# Patient Record
Sex: Male | Born: 1960 | Race: Black or African American | Hispanic: No | Smoking: Former smoker
Health system: Southern US, Community
[De-identification: ages and names within clinical notes are randomized; demographics above are authoritative.]

## PROBLEM LIST (undated history)

## (undated) HISTORY — PX: HERNIA REPAIR: SHX51

---

## 1998-01-17 ENCOUNTER — Encounter: Payer: Self-pay | Admitting: Emergency Medicine

## 1998-01-17 ENCOUNTER — Emergency Department (HOSPITAL_COMMUNITY): Admission: EM | Admit: 1998-01-17 | Discharge: 1998-01-17 | Payer: Self-pay | Admitting: Emergency Medicine

## 1998-10-29 ENCOUNTER — Emergency Department (HOSPITAL_COMMUNITY): Admission: EM | Admit: 1998-10-29 | Discharge: 1998-10-29 | Payer: Self-pay | Admitting: Emergency Medicine

## 2001-11-10 ENCOUNTER — Emergency Department (HOSPITAL_COMMUNITY): Admission: EM | Admit: 2001-11-10 | Discharge: 2001-11-11 | Payer: Self-pay | Admitting: Emergency Medicine

## 2001-11-11 ENCOUNTER — Encounter: Payer: Self-pay | Admitting: Emergency Medicine

## 2001-11-14 ENCOUNTER — Emergency Department (HOSPITAL_COMMUNITY): Admission: EM | Admit: 2001-11-14 | Discharge: 2001-11-15 | Payer: Self-pay | Admitting: Emergency Medicine

## 2001-11-14 ENCOUNTER — Encounter: Payer: Self-pay | Admitting: Emergency Medicine

## 2004-01-04 ENCOUNTER — Emergency Department (HOSPITAL_COMMUNITY): Admission: EM | Admit: 2004-01-04 | Discharge: 2004-01-04 | Payer: Self-pay

## 2005-02-07 ENCOUNTER — Emergency Department (HOSPITAL_COMMUNITY): Admission: EM | Admit: 2005-02-07 | Discharge: 2005-02-07 | Payer: Self-pay | Admitting: Emergency Medicine

## 2006-08-12 ENCOUNTER — Emergency Department (HOSPITAL_COMMUNITY): Admission: EM | Admit: 2006-08-12 | Discharge: 2006-08-12 | Payer: Self-pay | Admitting: Emergency Medicine

## 2007-12-14 ENCOUNTER — Emergency Department (HOSPITAL_COMMUNITY): Admission: EM | Admit: 2007-12-14 | Discharge: 2007-12-14 | Payer: Self-pay | Admitting: Emergency Medicine

## 2008-08-29 ENCOUNTER — Emergency Department (HOSPITAL_COMMUNITY): Admission: EM | Admit: 2008-08-29 | Discharge: 2008-08-29 | Payer: Self-pay | Admitting: Emergency Medicine

## 2009-08-29 ENCOUNTER — Emergency Department (HOSPITAL_COMMUNITY): Admission: EM | Admit: 2009-08-29 | Discharge: 2009-08-29 | Payer: Self-pay | Admitting: Emergency Medicine

## 2010-08-26 ENCOUNTER — Emergency Department (HOSPITAL_COMMUNITY): Payer: Self-pay

## 2010-08-26 ENCOUNTER — Emergency Department (HOSPITAL_COMMUNITY)
Admission: EM | Admit: 2010-08-26 | Discharge: 2010-08-26 | Disposition: A | Discharge status: Home / Self Care | Payer: Self-pay | Attending: Emergency Medicine | Admitting: Emergency Medicine

## 2010-08-26 DIAGNOSIS — M7989 Other specified soft tissue disorders: Secondary | ICD-10-CM | POA: Insufficient documentation

## 2010-08-26 DIAGNOSIS — M129 Arthropathy, unspecified: Secondary | ICD-10-CM | POA: Insufficient documentation

## 2010-08-26 DIAGNOSIS — M79609 Pain in unspecified limb: Secondary | ICD-10-CM | POA: Insufficient documentation

## 2010-08-26 DIAGNOSIS — M25469 Effusion, unspecified knee: Secondary | ICD-10-CM | POA: Insufficient documentation

## 2010-08-26 LAB — POCT I-STAT, CHEM 8
Chloride: 105 mEq/L (ref 96–112)
HCT: 43 % (ref 39.0–52.0)
Hemoglobin: 14.6 g/dL (ref 13.0–17.0)
Potassium: 3.9 mEq/L (ref 3.5–5.1)
Sodium: 142 mEq/L (ref 135–145)

## 2010-08-26 LAB — CBC
HCT: 41.3 % (ref 39.0–52.0)
Hemoglobin: 13.3 g/dL (ref 13.0–17.0)
RBC: 4.6 MIL/uL (ref 4.22–5.81)
RDW: 13.9 % (ref 11.5–15.5)
WBC: 7 10*3/uL (ref 4.0–10.5)

## 2010-08-26 LAB — DIFFERENTIAL
Basophils Absolute: 0 10*3/uL (ref 0.0–0.1)
Lymphocytes Relative: 28 % (ref 12–46)
Neutro Abs: 4.2 10*3/uL (ref 1.7–7.7)
Neutrophils Relative %: 59 % (ref 43–77)

## 2010-08-26 LAB — URIC ACID: Uric Acid, Serum: 4.7 mg/dL (ref 4.0–7.8)

## 2010-11-09 ENCOUNTER — Emergency Department (HOSPITAL_COMMUNITY): Payer: No Typology Code available for payment source

## 2010-11-09 ENCOUNTER — Emergency Department (HOSPITAL_COMMUNITY)
Admission: EM | Admit: 2010-11-09 | Discharge: 2010-11-09 | Disposition: A | Discharge status: Home / Self Care | Payer: No Typology Code available for payment source | Attending: Emergency Medicine | Admitting: Emergency Medicine

## 2010-11-09 ENCOUNTER — Encounter (HOSPITAL_COMMUNITY): Payer: Self-pay

## 2010-11-09 DIAGNOSIS — M545 Low back pain, unspecified: Secondary | ICD-10-CM | POA: Insufficient documentation

## 2010-11-09 DIAGNOSIS — M25439 Effusion, unspecified wrist: Secondary | ICD-10-CM | POA: Insufficient documentation

## 2010-11-09 DIAGNOSIS — M542 Cervicalgia: Secondary | ICD-10-CM | POA: Insufficient documentation

## 2010-11-09 DIAGNOSIS — S335XXA Sprain of ligaments of lumbar spine, initial encounter: Secondary | ICD-10-CM | POA: Insufficient documentation

## 2010-11-09 DIAGNOSIS — S63509A Unspecified sprain of unspecified wrist, initial encounter: Secondary | ICD-10-CM | POA: Insufficient documentation

## 2010-11-09 DIAGNOSIS — M25539 Pain in unspecified wrist: Secondary | ICD-10-CM | POA: Insufficient documentation

## 2010-11-12 ENCOUNTER — Inpatient Hospital Stay (INDEPENDENT_AMBULATORY_CARE_PROVIDER_SITE_OTHER)
Admission: RE | Admit: 2010-11-12 | Discharge: 2010-11-12 | Disposition: A | Discharge status: Home / Self Care | Payer: Self-pay | Source: Ambulatory Visit | Attending: Family Medicine | Admitting: Family Medicine

## 2010-11-12 DIAGNOSIS — S335XXA Sprain of ligaments of lumbar spine, initial encounter: Secondary | ICD-10-CM

## 2010-12-12 ENCOUNTER — Other Ambulatory Visit (HOSPITAL_COMMUNITY): Payer: Self-pay | Admitting: Chiropractic Medicine

## 2010-12-12 DIAGNOSIS — R52 Pain, unspecified: Secondary | ICD-10-CM

## 2010-12-17 ENCOUNTER — Ambulatory Visit (HOSPITAL_COMMUNITY)
Admission: RE | Admit: 2010-12-17 | Discharge: 2010-12-17 | Disposition: A | Discharge status: Home / Self Care | Payer: Self-pay | Source: Ambulatory Visit | Attending: Chiropractic Medicine | Admitting: Chiropractic Medicine

## 2010-12-17 DIAGNOSIS — M545 Low back pain, unspecified: Secondary | ICD-10-CM | POA: Insufficient documentation

## 2010-12-17 DIAGNOSIS — R52 Pain, unspecified: Secondary | ICD-10-CM

## 2010-12-17 DIAGNOSIS — M5126 Other intervertebral disc displacement, lumbar region: Secondary | ICD-10-CM | POA: Insufficient documentation

## 2011-01-20 ENCOUNTER — Ambulatory Visit (HOSPITAL_COMMUNITY)
Admission: RE | Admit: 2011-01-20 | Discharge: 2011-01-20 | Disposition: A | Discharge status: Home / Self Care | Payer: Self-pay | Source: Ambulatory Visit | Attending: Chiropractic Medicine | Admitting: Chiropractic Medicine

## 2011-01-20 ENCOUNTER — Other Ambulatory Visit (HOSPITAL_COMMUNITY): Payer: Self-pay | Admitting: Chiropractic Medicine

## 2011-01-20 DIAGNOSIS — M19029 Primary osteoarthritis, unspecified elbow: Secondary | ICD-10-CM | POA: Insufficient documentation

## 2011-01-20 DIAGNOSIS — M25529 Pain in unspecified elbow: Secondary | ICD-10-CM | POA: Insufficient documentation
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
## 2012-10-15 IMAGING — CR DG ELBOW COMPLETE 3+V*L*
4 series · 4 of 4 positions shown · non-contrast
Comparison: None.

***ADDENDUM*** CREATED: 01/24/2011 [DATE]

Indication should read left elbow pain.
***END ADDENDUM*** SIGNED BY: Quirijn Amazigh, M.D.
CLINICAL DATA: Right elbow pain and swelling.
LEFT ELBOW - COMPLETE 3+ VIEW

[x elbow joint ap left]
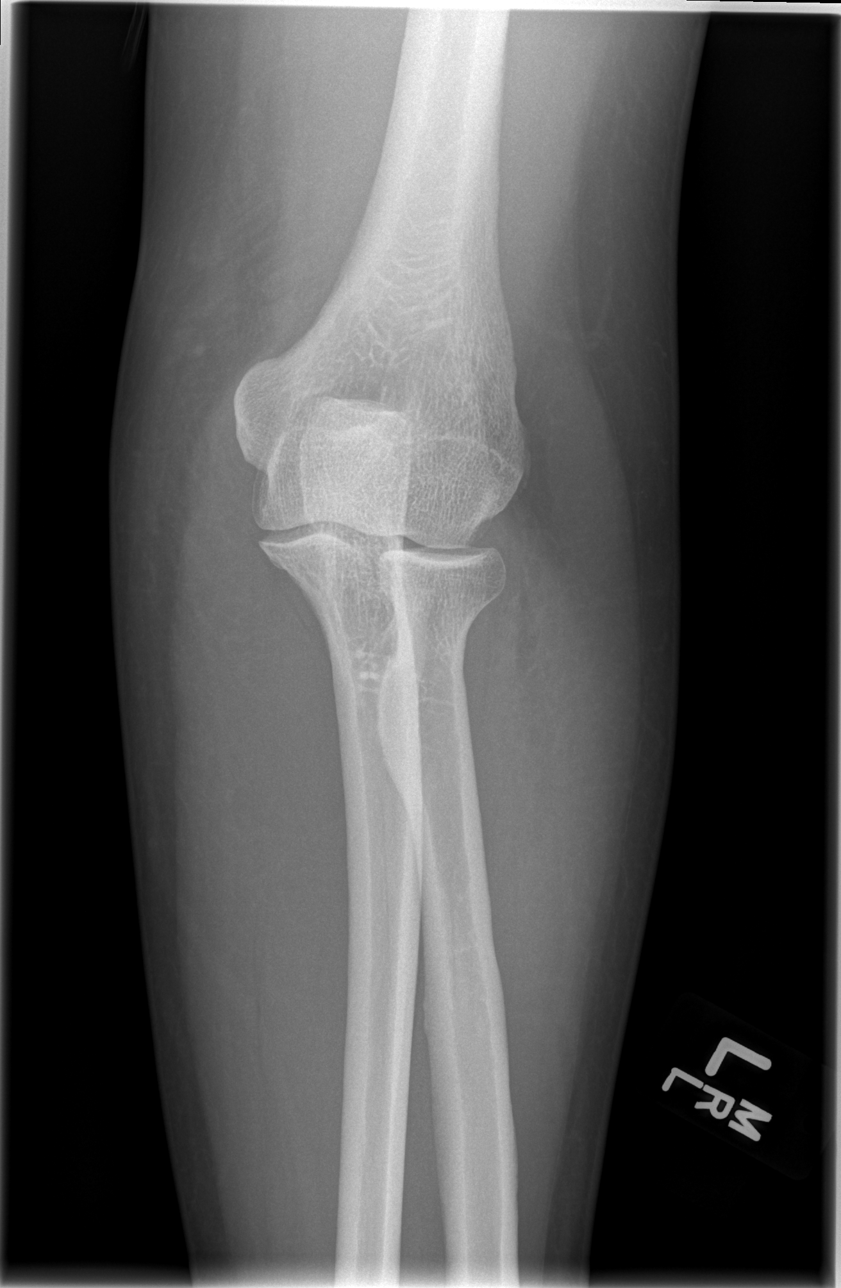

[x elbow joint obl. left (1 of 2)]
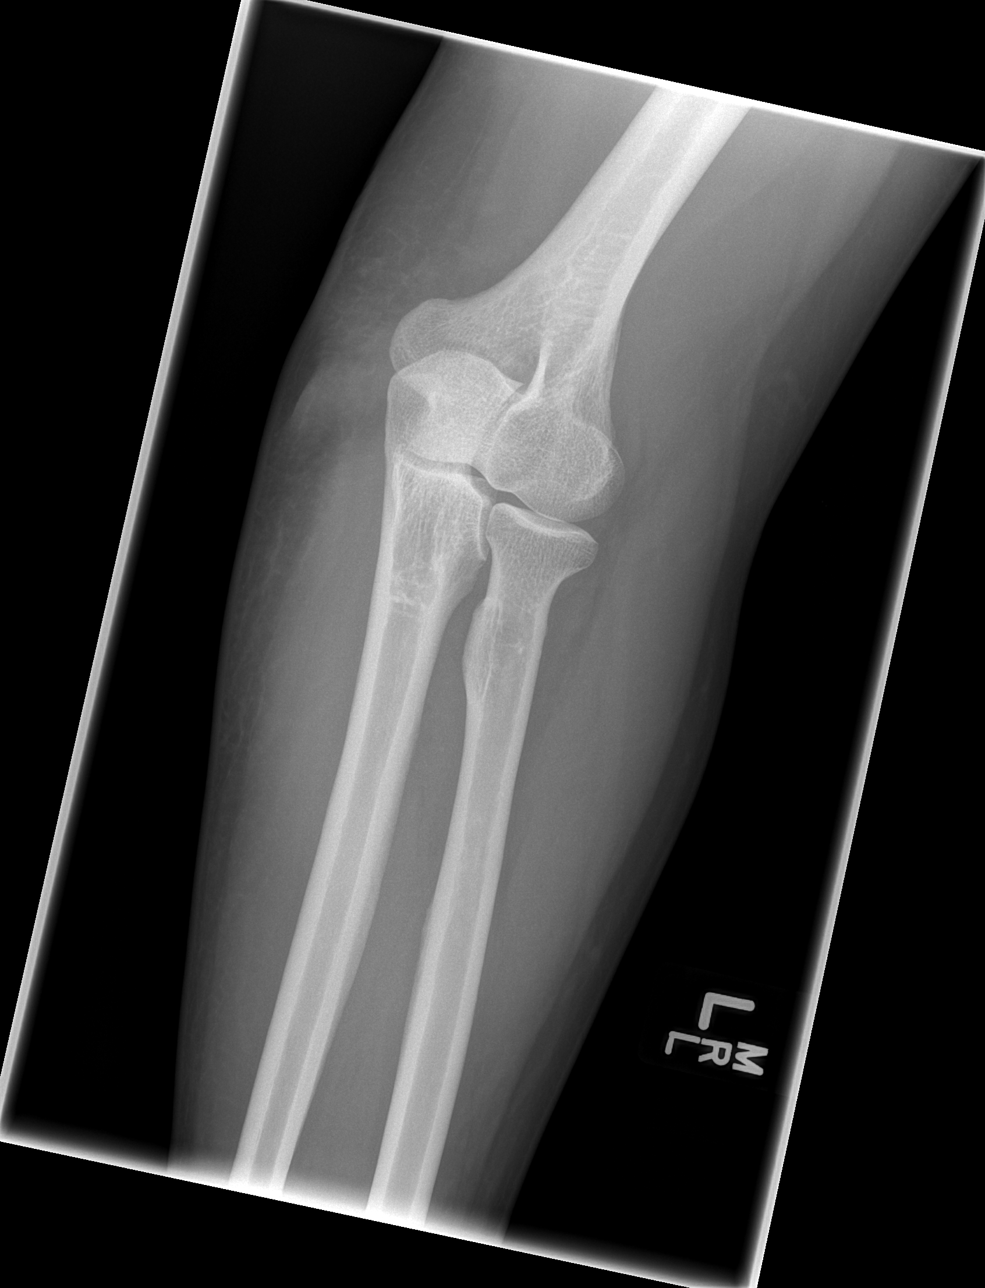

[x elbow joint obl. left (2 of 2)]
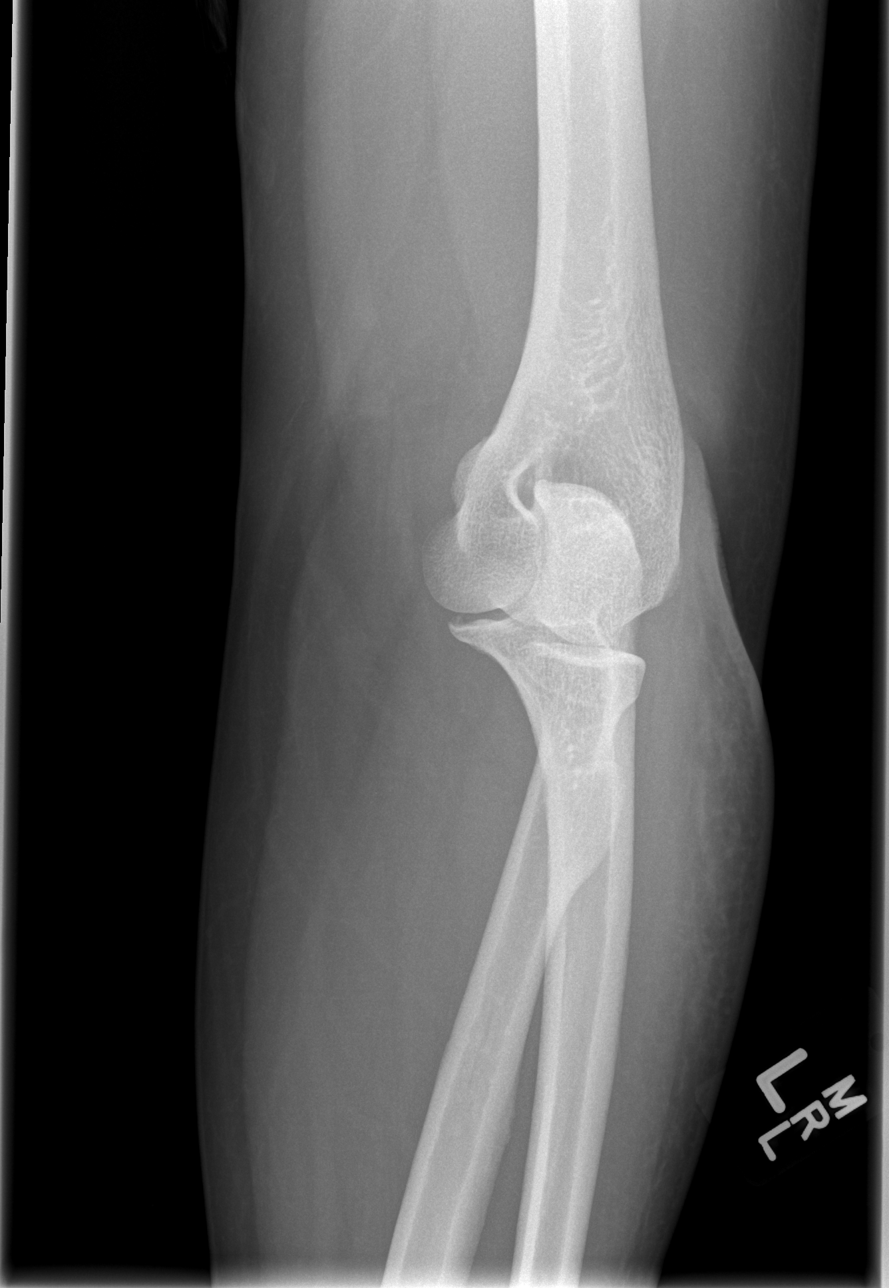

[x elbow joint lat left]
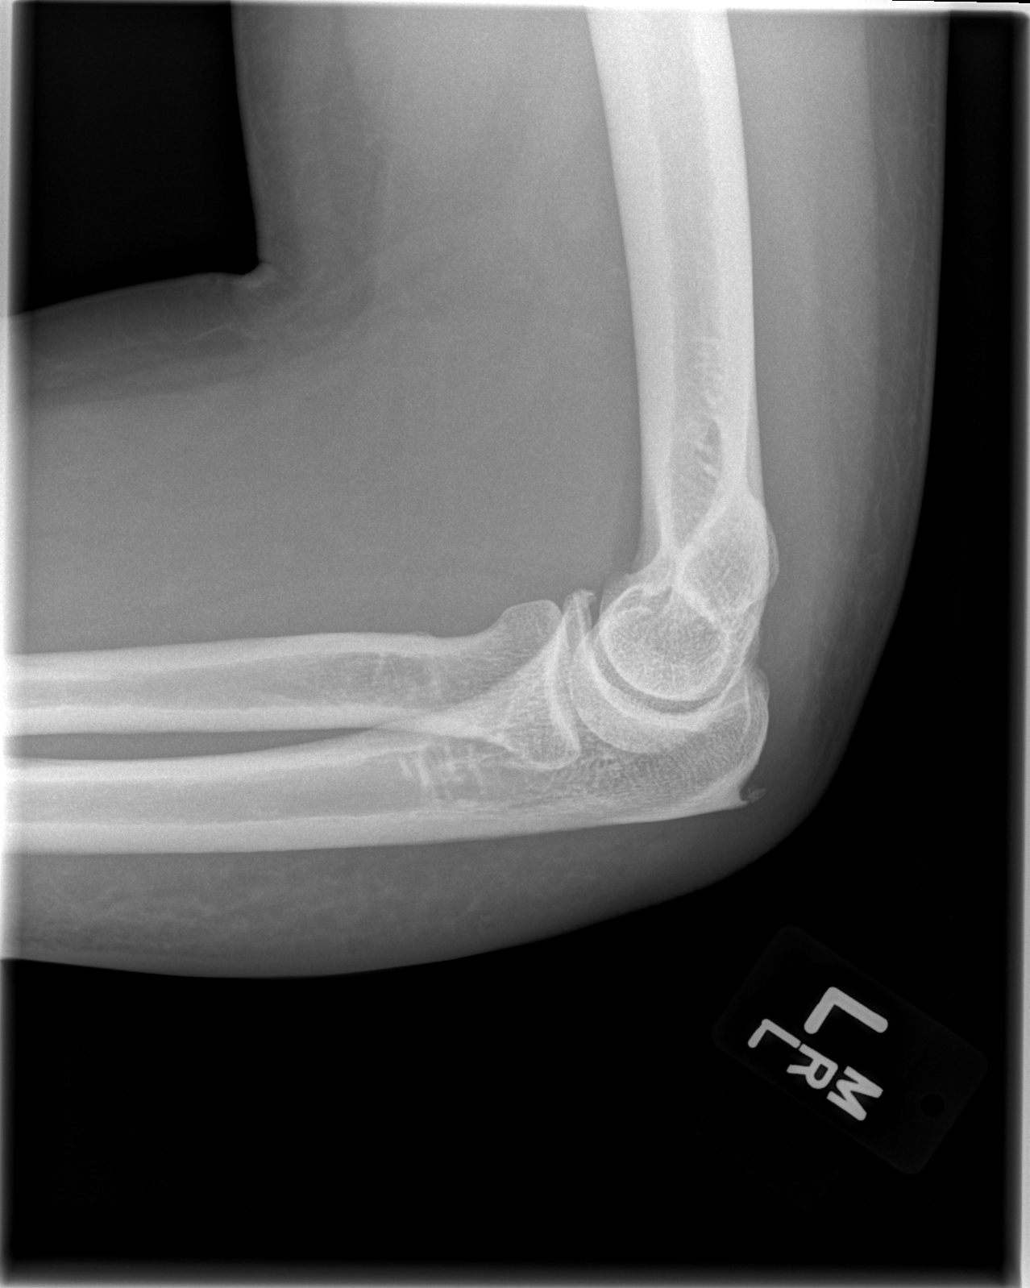

[4 of 4 positions shown; findings below may reference images not displayed]

FINDINGS: Enthesopathy is present at the insertion of the triceps
tendon.  There is no elbow effusion.  The alignment of the elbow is
anatomic.  Tiny spur off the coronoid process.  No loose body or
fracture.
IMPRESSION: Mild degenerative changes of the elbow without fracture.

## 2012-11-03 ENCOUNTER — Encounter (HOSPITAL_COMMUNITY): Payer: Self-pay | Admitting: Emergency Medicine

## 2012-11-03 ENCOUNTER — Emergency Department (HOSPITAL_COMMUNITY)
Admission: EM | Admit: 2012-11-03 | Discharge: 2012-11-04 | Disposition: A | Discharge status: Home / Self Care | Payer: Self-pay | Attending: Emergency Medicine | Admitting: Emergency Medicine

## 2012-11-03 DIAGNOSIS — N390 Urinary tract infection, site not specified: Secondary | ICD-10-CM | POA: Insufficient documentation

## 2012-11-03 DIAGNOSIS — F17200 Nicotine dependence, unspecified, uncomplicated: Secondary | ICD-10-CM | POA: Insufficient documentation

## 2012-11-03 DIAGNOSIS — R350 Frequency of micturition: Secondary | ICD-10-CM | POA: Insufficient documentation

## 2012-11-03 DIAGNOSIS — R3915 Urgency of urination: Secondary | ICD-10-CM | POA: Insufficient documentation

## 2012-11-03 DIAGNOSIS — R509 Fever, unspecified: Secondary | ICD-10-CM | POA: Insufficient documentation

## 2012-11-03 LAB — URINE MICROSCOPIC-ADD ON

## 2012-11-03 LAB — URINALYSIS, ROUTINE W REFLEX MICROSCOPIC
Ketones, ur: NEGATIVE mg/dL
Nitrite: NEGATIVE
Specific Gravity, Urine: 1.005 (ref 1.005–1.030)
pH: 6 (ref 5.0–8.0)

## 2012-11-03 LAB — POCT I-STAT, CHEM 8
Calcium, Ion: 1.17 mmol/L (ref 1.12–1.23)
Chloride: 105 mEq/L (ref 96–112)
Creatinine, Ser: 0.9 mg/dL (ref 0.50–1.35)
Glucose, Bld: 86 mg/dL (ref 70–99)
Potassium: 4 mEq/L (ref 3.5–5.1)

## 2012-11-03 MED ORDER — SODIUM CHLORIDE 0.9 % IV BOLUS (SEPSIS)
1000.0000 mL | Freq: Once | INTRAVENOUS | Status: AC
  Administered 2012-11-04: 1000 mL via INTRAVENOUS

## 2012-11-03 MED ORDER — DEXTROSE 5 % IV SOLN
1.0000 g | Freq: Once | INTRAVENOUS | Status: AC
  Administered 2012-11-04: via INTRAVENOUS
  Filled 2012-11-03: qty 10

## 2012-11-03 NOTE — ED Notes (Signed)
Needs urine sample

## 2012-11-03 NOTE — ED Notes (Signed)
The pt is still waiting to be seen.  He has been sleeping off and on

## 2012-11-03 NOTE — ED Notes (Signed)
The pt has had painful urination for one month intermittently.  He has frequency and voiding in small amounts.  No blood has been seen

## 2012-11-03 NOTE — ED Notes (Signed)
Pt waiting to be seen.

## 2012-11-03 NOTE — ED Notes (Addendum)
C/o dysuria x 1 month.  States symptoms got better for a while but are now getting worse.  Reports fever and chills that started today.  Took BC Powder at 7pm.  Pt also reports approx 100 lb unintentional weight loss over the past 8 months.

## 2012-11-04 LAB — CBC
HCT: 35.4 % — ABNORMAL LOW (ref 39.0–52.0)
MCH: 27.8 pg (ref 26.0–34.0)
MCV: 84.9 fL (ref 78.0–100.0)
Platelets: 284 10*3/uL (ref 150–400)
RBC: 4.17 MIL/uL — ABNORMAL LOW (ref 4.22–5.81)
WBC: 9.6 10*3/uL (ref 4.0–10.5)

## 2012-11-04 MED ORDER — ACETAMINOPHEN 325 MG PO TABS
650.0000 mg | ORAL_TABLET | Freq: Once | ORAL | Status: AC
  Administered 2012-11-04: 650 mg via ORAL
  Filled 2012-11-04: qty 2

## 2012-11-04 MED ORDER — CIPROFLOXACIN HCL 500 MG PO TABS: 500.0000 mg | ORAL_TABLET | Freq: Two times a day (BID) | ORAL | Status: DC

## 2012-11-04 MED ORDER — IBUPROFEN 600 MG PO TABS: 600.0000 mg | ORAL_TABLET | Freq: Four times a day (QID) | ORAL | Status: DC | PRN

## 2012-11-04 MED ORDER — SODIUM CHLORIDE 0.9 % IV BOLUS (SEPSIS)
1000.0000 mL | Freq: Once | INTRAVENOUS | Status: AC
  Administered 2012-11-04: 1000 mL via INTRAVENOUS

## 2012-11-04 NOTE — ED Notes (Signed)
Pt seen by dr Dierdre Highman

## 2012-11-04 NOTE — ED Notes (Signed)
Pt c/o being  Cold temp rechecked

## 2012-11-04 NOTE — ED Notes (Signed)
Pt up to the br. nss has infused

## 2012-11-04 NOTE — ED Notes (Signed)
The pt has been discharged but he still needs the last liter of fluid before he leaves

## 2012-11-04 NOTE — ED Provider Notes (Signed)
CSN: [id number]     Arrival date & time 11/03/12  2049 History     First MD Initiated Contact with Patient 11/04/12 0007     Chief Complaint  Patient presents with  . Dysuria   (Consider location/radiation/quality/duration/timing/severity/associated sxs/prior Treatment) HPI History provided by patient. For the last month has been having dysuria, urgency and frequency - a month ago it was moderate in severity but seemed to resolve and now has become more severe the last 24 hours. Chills and subjective fever at home today. No nausea or vomiting. He has burning with urination but denies any abdominal pain, back pain or rectal pain otherwise. He denies any history of prostate problems. No hematuria. No flank pain. Taking Goody powders at home with minimal relief. No primary care physician and denies any previous medical problems. Is not sexually active.  History reviewed. No pertinent past medical history. History reviewed. No pertinent past surgical history. No family history on file. History  Substance Use Topics  . Smoking status: Current Every Day Smoker  . Smokeless tobacco: Not on file  . Alcohol Use: Yes    Review of Systems  Constitutional: Positive for fever and chills.  HENT: Negative for neck pain and neck stiffness.   Eyes: Negative for pain.  Respiratory: Negative for shortness of breath.   Cardiovascular: Negative for chest pain.  Gastrointestinal: Negative for abdominal pain.  Genitourinary: Positive for dysuria, urgency and frequency.  Musculoskeletal: Negative for back pain.  Skin: Negative for rash.  Neurological: Negative for syncope, weakness and headaches.  All other systems reviewed and are negative.    Allergies  Review of patient's allergies indicates no known allergies.  Home Medications   Current Outpatient Rx  Name  Route  Sig  Dispense  Refill  . Aspirin-Caffeine (BC FAST PAIN RELIEF PO)   Oral   Take 1 packet by mouth 2 (two) times daily as  needed (for pain/fever).          BP 106/64  Pulse 78  Temp(Src) 100.3 F (37.9 C) (Oral)  Resp 18  SpO2 98% Physical Exam  Constitutional: He is oriented to person, place, and time. He appears well-developed and well-nourished.  HENT:  Head: Normocephalic and atraumatic.  Eyes: EOM are normal. Pupils are equal, round, and reactive to light.  Neck: Neck supple.  Cardiovascular: Normal rate, regular rhythm and intact distal pulses.   Pulmonary/Chest: Effort normal and breath sounds normal. No respiratory distress. He exhibits no tenderness.  Abdominal: Soft. Bowel sounds are normal. He exhibits no distension. There is no tenderness.  No CVA tenderness. No lower abdominal tenderness  Genitourinary:  Rectal exam: Prostate nontender, brown stool, no masses.  Musculoskeletal: Normal range of motion. He exhibits no edema.  Neurological: He is alert and oriented to person, place, and time.  Skin: Skin is warm and dry.    ED Course   Procedures (including critical care time)  Results for orders placed during the hospital encounter of 11/03/12  URINALYSIS, ROUTINE W REFLEX MICROSCOPIC      Result Value Range   Color, Urine YELLOW  YELLOW   APPearance CLOUDY (*) CLEAR   Specific Gravity, Urine 1.005  1.005 - 1.030   pH 6.0  5.0 - 8.0   Glucose, UA NEGATIVE  NEGATIVE mg/dL   Hgb urine dipstick MODERATE (*) NEGATIVE   Bilirubin Urine NEGATIVE  NEGATIVE   Ketones, ur NEGATIVE  NEGATIVE mg/dL   Protein, ur NEGATIVE  NEGATIVE mg/dL   Urobilinogen, UA 0.2  0.0 - 1.0 mg/dL   Nitrite NEGATIVE  NEGATIVE   Leukocytes, UA LARGE (*) NEGATIVE  URINE MICROSCOPIC-ADD ON      Result Value Range   Squamous Epithelial / LPF FEW (*) RARE   WBC, UA TOO NUMEROUS TO COUNT  <3 WBC/hpf   RBC / HPF 3-6  <3 RBC/hpf   Bacteria, UA FEW (*) RARE  CBC      Result Value Range   WBC 9.6  4.0 - 10.5 K/uL   RBC 4.17 (*) 4.22 - 5.81 MIL/uL   Hemoglobin 11.6 (*) 13.0 - 17.0 g/dL   HCT 16.1 (*) 09.6 -  52.0 %   MCV 84.9  78.0 - 100.0 fL   MCH 27.8  26.0 - 34.0 pg   MCHC 32.8  30.0 - 36.0 g/dL   RDW 04.5  40.9 - 81.1 %   Platelets 284  150 - 400 K/uL  CG4 I-STAT (LACTIC ACID)      Result Value Range   Lactic Acid, Venous 0.63  0.5 - 2.2 mmol/L  POCT I-STAT, CHEM 8      Result Value Range   Sodium 143  135 - 145 mEq/L   Potassium 4.0  3.5 - 5.1 mEq/L   Chloride 105  96 - 112 mEq/L   BUN 6  6 - 23 mg/dL   Creatinine, Ser 9.14  0.50 - 1.35 mg/dL   Glucose, Bld 86  70 - 99 mg/dL   Calcium, Ion 7.82  9.56 - 1.23 mmol/L   TCO2 26  0 - 100 mmol/L   Hemoglobin 12.6 (*) 13.0 - 17.0 g/dL   HCT 21.3 (*) 08.6 - 57.8 %   IV fluids. IV Rocephin.   2:07 AM on recheck is feeling better. I offered admit, PT prefers discharge and agrees to outpatient follow up with strict return precautions verbalized as understood.    Plan ABx, U Cx pending, urology and PCP referral for reported recent unintentional weight loss.  MDM  Dysuria, fever chills Labs reviewed as above no leukocytosis and normal lactic acid IV antibiotics for UTI reviewed urinalysis. Urine culture is pending IV fluids provided. Vital signs and nursing notes reviewed - patient admits to unintentional weight loss over the last few months. He states understanding urgent need for outpatient workup, referrals provided.   Sunnie Nielsen, MD 11/04/12 0300

## 2012-11-04 NOTE — ED Notes (Signed)
The pt still has not been seen by the edp orders placed for iv labs and meds.

## 2012-11-04 NOTE — ED Notes (Signed)
The pt is sleeping 

## 2012-11-06 LAB — URINE CULTURE: Colony Count: >=100000

## 2012-11-09 ENCOUNTER — Telehealth (HOSPITAL_COMMUNITY): Payer: Self-pay | Admitting: Emergency Medicine

## 2012-11-09 NOTE — ED Notes (Signed)
Post ED Visit - Positive Culture Follow-up  Culture report reviewed by antimicrobial stewardship pharmacist: []  Wes Dulaney, Pharm.D., BCPS [x]  Celedonio Miyamoto, Pharm.D., BCPS []  Georgina Pillion, [address].D., BCPS []  [place], [address].D., BCPS, AAHIVP []  Estella Husk, Pharm.D., BCPS, AAHIVP  Positive urine culture Treated with Cipro, organism sensitive to the same and no further patient follow-up is required at this time.  Kylie A Holland 11/09/2012, 10:25 AM

## 2013-12-04 ENCOUNTER — Encounter (HOSPITAL_COMMUNITY): Payer: Self-pay | Admitting: Emergency Medicine

## 2013-12-04 ENCOUNTER — Emergency Department (HOSPITAL_COMMUNITY)
Admission: EM | Admit: 2013-12-04 | Discharge: 2013-12-05 | Disposition: A | Discharge status: Home / Self Care | Payer: No Typology Code available for payment source | Attending: Emergency Medicine | Admitting: Emergency Medicine

## 2013-12-04 DIAGNOSIS — B354 Tinea corporis: Secondary | ICD-10-CM | POA: Insufficient documentation

## 2013-12-04 DIAGNOSIS — R109 Unspecified abdominal pain: Secondary | ICD-10-CM | POA: Insufficient documentation

## 2013-12-04 DIAGNOSIS — K4090 Unilateral inguinal hernia, without obstruction or gangrene, not specified as recurrent: Secondary | ICD-10-CM | POA: Insufficient documentation

## 2013-12-04 DIAGNOSIS — F17200 Nicotine dependence, unspecified, uncomplicated: Secondary | ICD-10-CM | POA: Insufficient documentation

## 2013-12-04 NOTE — ED Notes (Signed)
Pain in groin is present with physical activity and coughing.

## 2013-12-04 NOTE — ED Notes (Signed)
Pt c/o groin pain and swelling x 1 week, also c/o rash to right arm.

## 2013-12-05 MED ORDER — KETOCONAZOLE 2 % EX CREA: 1.0000 "application " | TOPICAL_CREAM | Freq: Every day | CUTANEOUS | Status: AC

## 2013-12-05 MED ORDER — OXYCODONE-ACETAMINOPHEN 5-325 MG PO TABS: 2.0000 | ORAL_TABLET | ORAL | Status: AC | PRN

## 2013-12-05 NOTE — Discharge Instructions (Signed)
Body Ringworm Ringworm (tinea corporis) is a fungal infection of the skin on the body. This infection is not caused by worms, but is actually caused by a fungus. Fungus normally lives on the top of your skin and can be useful. However, in the case of ringworms, the fungus grows out of control and causes a skin infection. It can involve any area of skin on the body and can spread easily from one person to another (contagious). Ringworm is a common problem for children, but it can affect adults as well. Ringworm is also often found in athletes, especially wrestlers who share equipment and mats.  CAUSES  Ringworm of the body is caused by a fungus called dermatophyte. It can spread by:  Touchingother people who are infected.  Touchinginfected pets.  Touching or sharingobjects that have been in contact with the infected person or pet (hats, combs, towels, clothing, sports equipment). SYMPTOMS   Itchy, raised red spots and bumps on the skin.  Ring-shaped rash.  Redness near the border of the rash with a clear center.  Dry and scaly skin on or around the rash. Not every person develops a ring-shaped rash. Some develop only the red, scaly patches. DIAGNOSIS  Most often, ringworm can be diagnosed by performing a skin exam. Your caregiver may choose to take a skin scraping from the affected area. The sample will be examined under the microscope to see if the fungus is present.  TREATMENT  Body ringworm may be treated with a topical antifungal cream or ointment. Sometimes, an antifungal shampoo that can be used on your body is prescribed. You may be prescribed antifungal medicines to take by mouth if your ringworm is severe, keeps coming back, or lasts a long time.  HOME CARE INSTRUCTIONS   Only take over-the-counter or prescription medicines as directed by your caregiver.  Wash the infected area and dry it completely before applying yourcream or ointment.  When using antifungal shampoo to  treat the ringworm, leave the shampoo on the body for 3-5 minutes before rinsing.   Wear loose clothing to stop clothes from rubbing and irritating the rash.  Wash or change your bed sheets every night while you have the rash.  Have your pet treated by your veterinarian if it has the same infection. To prevent ringworm:   Practice good hygiene.  Wear sandals or shoes in public places and showers.  Do not share personal items with others.  Avoid touching red patches of skin on other people.  Avoid touching pets that have bald spots or wash your hands after doing so. SEEK MEDICAL CARE IF:   Your rash continues to spread after 7 days of treatment.  Your rash is not gone in 4 weeks.  The area around your rash becomes red, warm, tender, and swollen. Document Released: 03/10/2000 Document Revised: 12/06/2011 Document Reviewed: 09/25/2011 [facility] Patient Information 2015 [place], [place]. This information is not intended to replace advice given to you by your health care provider. Make sure you discuss any questions you have with your health care provider.  Inguinal Hernia, Adult Muscles help keep everything in the body in its proper place. But if a weak spot in the muscles develops, something can poke through. That is called a hernia. When this happens in the lower part of the belly (abdomen), it is called an inguinal hernia. (It takes its name from a part of the body in this region called the inguinal canal.) A weak spot in the wall of muscles lets  some fat or part of the small intestine bulge through. An inguinal hernia can develop at any age. Men get them more often than women. CAUSES  In adults, an inguinal hernia develops over time.  It can be triggered by:  Suddenly straining the muscles of the lower abdomen.  Lifting heavy objects.  Straining to have a bowel movement. Difficult bowel movements (constipation) can lead to this.  Constant coughing. This may be caused by  smoking or lung disease.  Being overweight.  Being pregnant.  Working at a job that requires long periods of standing or heavy lifting.  Having had an inguinal hernia before. One type can be an emergency situation. It is called a strangulated inguinal hernia. It develops if part of the small intestine slips through the weak spot and cannot get back into the abdomen. The blood supply can be cut off. If that happens, part of the intestine may die. This situation requires emergency surgery. SYMPTOMS  Often, a small inguinal hernia has no symptoms. It is found when a healthcare provider does a physical exam. Larger hernias usually have symptoms.   In adults, symptoms may include:  A lump in the groin. This is easier to see when the person is standing. It might disappear when lying down.  In men, a lump in the scrotum.  Pain or burning in the groin. This occurs especially when lifting, straining or coughing.  A dull ache or feeling of pressure in the groin.  Signs of a strangulated hernia can include:  A bulge in the groin that becomes very painful and tender to the touch.  A bulge that turns red or purple.  Fever, nausea and vomiting.  Inability to have a bowel movement or to pass gas. DIAGNOSIS  To decide if you have an inguinal hernia, a healthcare provider will probably do a physical examination.  This will include asking questions about any symptoms you have noticed.  The healthcare provider might feel the groin area and ask you to cough. If an inguinal hernia is felt, the healthcare provider may try to slide it back into the abdomen.  Usually no other tests are needed. TREATMENT  Treatments can vary. The size of the hernia makes a difference. Options include:  Watchful waiting. This is often suggested if the hernia is small and you have had no symptoms.  No medical procedure will be done unless symptoms develop.  You will need to watch closely for symptoms. If any  occur, contact your healthcare provider right away.  Surgery. This is used if the hernia is larger or you have symptoms.  Open surgery. This is usually an outpatient procedure (you will not stay overnight in a hospital). An cut (incision) is made through the skin in the groin. The hernia is put back inside the abdomen. The weak area in the muscles is then repaired by herniorrhaphy or hernioplasty. Herniorrhaphy: in this type of surgery, the weak muscles are sewn back together. Hernioplasty: a patch or mesh is used to close the weak area in the abdominal wall.  Laparoscopy. In this procedure, a surgeon makes small incisions. A thin tube with a tiny video camera (called a laparoscope) is put into the abdomen. The surgeon repairs the hernia with mesh by looking with the video camera and using two long instruments. HOME CARE INSTRUCTIONS   After surgery to repair an inguinal hernia:  You will need to take pain medicine prescribed by your healthcare provider. Follow all directions carefully.  You will  need to take care of the wound from the incision.  Your activity will be restricted for awhile. This will probably include no heavy lifting for several weeks. You also should not do anything too active for a few weeks. When you can return to work will depend on the type of job that you have.  During "watchful waiting" periods, you should:  Maintain a healthy weight.  Eat a diet high in fiber (fruits, vegetables and whole grains).  Drink plenty of fluids to avoid constipation. This means drinking enough water and other liquids to keep your urine clear or pale yellow.  Do not lift heavy objects.  Do not stand for long periods of time.  Quit smoking. This should keep you from developing a frequent cough. SEEK MEDICAL CARE IF:   A bulge develops in your groin area.  You feel pain, a burning sensation or pressure in the groin. This might be worse if you are lifting or straining.  You develop a  fever of more than 100.5 F (38.1 C). SEEK IMMEDIATE MEDICAL CARE IF:   Pain in the groin increases suddenly.  A bulge in the groin gets bigger suddenly and does not go down.  For men, there is sudden pain in the scrotum. Or, the size of the scrotum increases.  A bulge in the groin area becomes red or purple and is painful to touch.  You have nausea or vomiting that does not go away.  You feel your heart beating much faster than normal.  You cannot have a bowel movement or pass gas.  You develop a fever of more than 102.0 F (38.9 C). Document Released: 07/30/2008 Document Revised: 06/05/2011 Document Reviewed: 07/30/2008 [facility] Patient Information 2015 [place], [place]. This information is not intended to replace advice given to you by your health care provider. Make sure you discuss any questions you have with your health care provider.

## 2013-12-05 NOTE — ED Provider Notes (Signed)
CSN: [id number]     Arrival date & time 12/04/13  1805 History   First MD Initiated Contact with Patient 12/04/13 2302     Chief Complaint  Patient presents with  . Groin Pain  . Rash    right arm     (Consider location/radiation/quality/duration/timing/severity/associated sxs/prior Treatment) HPI 53 year old male presents to emergency room with complaint of right sided groin swelling for one week with dull pain.  Patient also complains of rash to right arm that is pruritic, ongoing for about a month.  Patient works as a Curator, often lifts heavy objects.  He denies any dysuria, no abdominal pain, no other complaints. History reviewed. No pertinent past medical history. History reviewed. No pertinent past surgical history. No family history on file. History  Substance Use Topics  . Smoking status: Current Every Day Smoker  . Smokeless tobacco: Not on file  . Alcohol Use: Yes    Review of Systems   See History of Present Illness; otherwise all other systems are reviewed and negative  Allergies  Review of patient's allergies indicates no known allergies.  Home Medications   Prior to Admission medications   Medication Sig Start Date End Date Taking? Authorizing Provider  Aspirin-Caffeine (BC FAST PAIN RELIEF PO) Take 1 packet by mouth 2 (two) times daily as needed (for pain/fever).   Yes Historical Provider, MD   BP 113/68  Pulse 72  Temp(Src) 98.4 F (36.9 C) (Oral)  Resp 16  SpO2 100% Physical Exam  Nursing note and vitals reviewed. Constitutional: He is oriented to person, place, and time. He appears well-developed and well-nourished. No distress.  HENT:  Head: Normocephalic and atraumatic.  Nose: Nose normal.  Mouth/Throat: Oropharynx is clear and moist.  Eyes: Conjunctivae and EOM are normal. Pupils are equal, round, and reactive to light.  Neck: Normal range of motion. Neck supple. No JVD present. No tracheal deviation present. No thyromegaly present.   Cardiovascular: Normal rate, regular rhythm, normal heart sounds and intact distal pulses.  Exam reveals no gallop and no friction rub.   No murmur heard. Pulmonary/Chest: Effort normal and breath sounds normal. No stridor. No respiratory distress. He has no wheezes. He has no rales. He exhibits no tenderness.  Abdominal: Soft. Bowel sounds are normal. He exhibits mass (reducible right inguinal hernia). He exhibits no distension. There is no tenderness. There is no rebound and no guarding.  Musculoskeletal: Normal range of motion. He exhibits no edema and no tenderness.  Lymphadenopathy:    He has no cervical adenopathy.  Neurological: He is alert and oriented to person, place, and time. He displays normal reflexes. He exhibits normal muscle tone. Coordination normal.  Skin: Skin is warm and dry. Rash noted. No erythema. No pallor.  Circular rash with secondary excoriation to right antecubital fossa  Psychiatric: He has a normal mood and affect. His behavior is normal. Judgment and thought content normal.    ED Course  Procedures (including critical care time) Labs Review Labs Reviewed - No data to display  Imaging Review No results found.   EKG Interpretation None      MDM   Final diagnoses:  Right inguinal hernia  Tinea corporis   53 year old male with right inguinal hernia.  We'll prescribe pain medicine, patient given instructions for return for worsening pain or signs of incarcerated hernia.  Patient instructed to reduce lifting or straining.  Will refer to Gen. surgery.  Rash appears to be ringworm, will treat for same.  Olivia Mackie, MD  12/05/13 0011 

## 2013-12-12 ENCOUNTER — Encounter: Payer: Self-pay | Admitting: Internal Medicine

## 2013-12-12 ENCOUNTER — Ambulatory Visit: Payer: Self-pay | Attending: Internal Medicine | Admitting: Internal Medicine

## 2013-12-12 VITALS — BP 133/85 | HR 65 | Temp 98.0°F | Resp 16 | Wt 206.4 lb

## 2013-12-12 DIAGNOSIS — K4090 Unilateral inguinal hernia, without obstruction or gangrene, not specified as recurrent: Secondary | ICD-10-CM | POA: Insufficient documentation

## 2013-12-12 DIAGNOSIS — F17200 Nicotine dependence, unspecified, uncomplicated: Secondary | ICD-10-CM | POA: Insufficient documentation

## 2013-12-12 DIAGNOSIS — Z23 Encounter for immunization: Secondary | ICD-10-CM | POA: Insufficient documentation

## 2013-12-12 DIAGNOSIS — R21 Rash and other nonspecific skin eruption: Secondary | ICD-10-CM | POA: Insufficient documentation

## 2013-12-12 DIAGNOSIS — Z139 Encounter for screening, unspecified: Secondary | ICD-10-CM

## 2013-12-12 LAB — CBC WITH DIFFERENTIAL/PLATELET
Basophils Absolute: 0.1 10*3/uL (ref 0.0–0.1)
Basophils Relative: 1 % (ref 0–1)
Eosinophils Absolute: 0.4 10*3/uL (ref 0.0–0.7)
Eosinophils Relative: 7 % — ABNORMAL HIGH (ref 0–5)
HCT: 38.4 % — ABNORMAL LOW (ref 39.0–52.0)
Hemoglobin: 12.3 g/dL — ABNORMAL LOW (ref 13.0–17.0)
LYMPHS ABS: 1.5 10*3/uL (ref 0.7–4.0)
LYMPHS PCT: 26 % (ref 12–46)
MCH: 27.8 pg (ref 26.0–34.0)
MCHC: 32 g/dL (ref 30.0–36.0)
MCV: 86.7 fL (ref 78.0–100.0)
Monocytes Absolute: 0.4 10*3/uL (ref 0.1–1.0)
Monocytes Relative: 7 % (ref 3–12)
Neutro Abs: 3.5 10*3/uL (ref 1.7–7.7)
Neutrophils Relative %: 59 % (ref 43–77)
Platelets: 388 10*3/uL (ref 150–400)
RBC: 4.43 MIL/uL (ref 4.22–5.81)
RDW: 14.9 % (ref 11.5–15.5)
WBC: 5.9 10*3/uL (ref 4.0–10.5)

## 2013-12-12 LAB — COMPLETE METABOLIC PANEL WITH GFR
ALK PHOS: 71 U/L (ref 39–117)
ALT: 8 U/L (ref 0–53)
AST: 12 U/L (ref 0–37)
Albumin: 3.4 g/dL — ABNORMAL LOW (ref 3.5–5.2)
BUN: 13 mg/dL (ref 6–23)
CALCIUM: 9.1 mg/dL (ref 8.4–10.5)
CO2: 28 meq/L (ref 19–32)
CREATININE: 0.81 mg/dL (ref 0.50–1.35)
Chloride: 108 mEq/L (ref 96–112)
GFR, Est Non African American: >89 mL/min
Glucose, Bld: 130 mg/dL — ABNORMAL HIGH (ref 70–99)
Potassium: 4.9 mEq/L (ref 3.5–5.3)
SODIUM: 142 meq/L (ref 135–145)
TOTAL PROTEIN: 6.9 g/dL (ref 6.0–8.3)
Total Bilirubin: 0.2 mg/dL (ref 0.2–1.2)

## 2013-12-12 LAB — LIPID PANEL
CHOL/HDL RATIO: 3.7 ratio
CHOLESTEROL: 128 mg/dL (ref 0–200)
HDL: 35 mg/dL — AB (ref >39)
LDL CALC: 80 mg/dL (ref 0–99)
Triglycerides: 63 mg/dL (ref <150)
VLDL: 13 mg/dL (ref 0–40)

## 2013-12-12 MED ORDER — NICOTINE 21 MG/24HR TD PT24: 21.0000 mg | MEDICATED_PATCH | Freq: Every day | TRANSDERMAL | Status: AC

## 2013-12-12 MED ORDER — HYDROCORTISONE 1 % EX CREA: 1.0000 "application " | TOPICAL_CREAM | Freq: Two times a day (BID) | CUTANEOUS | Status: AC

## 2013-12-12 NOTE — Progress Notes (Signed)
Patient Demographics  Jacob Mathews, is a 53 y.o. male  WUJ:[id number]  [id number]:[id number]  DOB - 19-Oct-1960  CC:  Chief Complaint  Patient presents with  . Hernia       HPI: Jacob Mathews is a 53 y.o. male here today to establish medical care.Patient recently went to the emergency room with the symptoms of right inguinal swelling, EMR reviewed patient was diagnosed with right inguinal hernia as well as he had a rash on his arm and was treated for the skin fungal infection and was prescribed antifungal medication as well as pain medication patient was advised to follow with surgery. As per patient the rash is slightly improved, the pain medication which he was prescribed makes him nauseous denies any change in bowel habits denies any vomiting Patient has No headache, No chest pain, No abdominal pain - No Nausea, No new weakness tingling or numbness, No Cough - SOB.  No Known Allergies History reviewed. No pertinent past medical history. Current Outpatient Prescriptions on File Prior to Visit  Medication Sig Dispense Refill  . Aspirin-Caffeine (BC FAST PAIN RELIEF PO) Take 1 packet by mouth 2 (two) times daily as needed (for pain/fever).      Marland Kitchen ketoconazole (NIZORAL) 2 % cream Apply 1 application topically daily. For 2 weeks  30 g  0  . oxyCODONE-acetaminophen (PERCOCET/ROXICET) 5-325 MG per tablet Take 2 tablets by mouth every 4 (four) hours as needed for severe pain.  20 tablet  0   No current facility-administered medications on file prior to visit.   Family History  Problem Relation Age of Onset  . Hypertension Mother   . Stroke Mother   . Hypertension Sister   . Diabetes Sister   . Hypertension Brother   . Diabetes Brother   . Stroke Brother    History   Social History  . Marital Status: Divorced    Spouse Name: N/A    Number of Children: N/A  . Years of Education: N/A   Occupational History  . Not on file.   Social History Main Topics  . Smoking status: Current  Every Day Smoker -- 0.50 packs/day for 40 years  . Smokeless tobacco: Not on file  . Alcohol Use: Yes  . Drug Use: No  . Sexual Activity: Not on file   Other Topics Concern  . Not on file   Social History Narrative  . No narrative on file    Review of Systems: Constitutional: Negative for fever, chills, diaphoresis, activity change, appetite change and fatigue. HENT: Negative for ear pain, nosebleeds, congestion, facial swelling, rhinorrhea, neck pain, neck stiffness and ear discharge.  Eyes: Negative for pain, discharge, redness, itching and visual disturbance. Respiratory: Negative for cough, choking, chest tightness, shortness of breath, wheezing and stridor.  Cardiovascular: Negative for chest pain, palpitations and leg swelling. Gastrointestinal: Negative for abdominal distention. Genitourinary: Negative for dysuria, urgency, frequency, hematuria, flank pain, decreased urine volume, difficulty urinating and dyspareunia.  Musculoskeletal: Negative for back pain, joint swelling, arthralgia and gait problem. Neurological: Negative for dizziness, tremors, seizures, syncope, facial asymmetry, speech difficulty, weakness, light-headedness, numbness and headaches.  Hematological: Negative for adenopathy. Does not bruise/bleed easily. Psychiatric/Behavioral: Negative for hallucinations, behavioral problems, confusion, dysphoric mood, decreased concentration and agitation.    Objective:   Filed Vitals:   12/12/13 0929  BP: 133/85  Pulse: 65  Temp: 98 F (36.7 C)  Resp: 16    Physical Exam: Constitutional: Patient appears well-developed and well-nourished. No distress. HENT: Normocephalic,  atraumatic, External right and left ear normal. Oropharynx is clear and moist.  Eyes: Conjunctivae and EOM are normal. PERRLA, no scleral icterus. Neck: Normal ROM. Neck supple. No JVD. No tracheal deviation. No thyromegaly. CVS: RRR, S1/S2 +, no murmurs, no gallops, no carotid bruit.    Pulmonary: Effort and breath sounds normal, no stridor, rhonchi, wheezes, rales.  Abdominal: Soft. BS +, no distension, tenderness, rebound or guarding.right inguinal hernia increases with cough reducible  Musculoskeletal: Normal range of motion. No edema and no tenderness.  Neuro: Alert. Normal reflexes, muscle tone coordination. No cranial nerve deficit. Skin: Rash/dry skin on right forearm.  Psychiatric: Normal mood and affect. Behavior, judgment, thought content normal.  Lab Results  Component Value Date   WBC 9.6 11/03/2012   HGB 12.6* 11/03/2012   HCT 37.0* 11/03/2012   MCV 84.9 11/03/2012   PLT 284 11/03/2012   Lab Results  Component Value Date   CREATININE 0.90 11/03/2012   BUN 6 11/03/2012   NA 143 11/03/2012   K 4.0 11/03/2012   CL 105 11/03/2012    No results found for this basename: HGBA1C   Lipid Panel  No results found for this basename: chol, trig, hdl, cholhdl, vldl, ldlcalc       Assessment and plan:   1. Right inguinal hernia  - Ambulatory referral to General Surgery  2. Screening Ordered baseline blood work - CBC with Differential - COMPLETE METABOLIC PANEL WITH GFR - TSH - Lipid panel - Vit D  25 hydroxy (rtn osteoporosis monitoring)  3. Smoking Advise patient to quit smoking. - nicotine (NICODERM CQ) 21 mg/24hr patch; Place 1 patch (21 mg total) onto the skin daily.  Dispense: 28 patch; Refill: 0  4. Rash and nonspecific skin eruption  - hydrocortisone cream 1 %; Apply 1 application topically 2 (two) times daily.  Dispense: 30 g; Refill: 1   Health Maintenance   -Influenza shot given today   Return in about 3 months (around 03/13/2014).     Doris Cheadle, MD

## 2013-12-12 NOTE — Progress Notes (Signed)
Patient here to establish care Complains of lower right side hernia Was seen in the ED for this and states the pain has gotten worse

## 2013-12-12 NOTE — Patient Instructions (Signed)
Smoking Cessation Quitting smoking is important to your health and has many advantages. However, it is not always easy to quit since nicotine is a very addictive drug. Oftentimes, people try 3 times or more before being able to quit. This document explains the best ways for you to prepare to quit smoking. Quitting takes hard work and a lot of effort, but you can do it. ADVANTAGES OF QUITTING SMOKING  You will live longer, feel better, and live better.  Your body will feel the impact of quitting smoking almost immediately.  Within 20 minutes, blood pressure decreases. Your pulse returns to its normal level.  After 8 hours, carbon monoxide levels in the blood return to normal. Your oxygen level increases.  After 24 hours, the chance of having a heart attack starts to decrease. Your breath, hair, and body stop smelling like smoke.  After 48 hours, damaged nerve endings begin to recover. Your sense of taste and smell improve.  After 72 hours, the body is virtually free of nicotine. Your bronchial tubes relax and breathing becomes easier.  After 2 to 12 weeks, lungs can hold more air. Exercise becomes easier and circulation improves.  The risk of having a heart attack, stroke, cancer, or lung disease is greatly reduced.  After 1 year, the risk of coronary heart disease is cut in half.  After 5 years, the risk of stroke falls to the same as a nonsmoker.  After 10 years, the risk of lung cancer is cut in half and the risk of other cancers decreases significantly.  After 15 years, the risk of coronary heart disease drops, usually to the level of a nonsmoker.  If you are pregnant, quitting smoking will improve your chances of having a healthy baby.  The people you live with, especially any children, will be healthier.  You will have extra money to spend on things other than cigarettes. QUESTIONS TO THINK ABOUT BEFORE ATTEMPTING TO QUIT You may want to talk about your answers with your  health care provider.  Why do you want to quit?  If you tried to quit in the past, what helped and what did not?  What will be the most difficult situations for you after you quit? How will you plan to handle them?  Who can help you through the tough times? Your family? Friends? A health care provider?  What pleasures do you get from smoking? What ways can you still get pleasure if you quit? Here are some questions to ask your health care provider:  How can you help me to be successful at quitting?  What medicine do you think would be best for me and how should I take it?  What should I do if I need more help?  What is smoking withdrawal like? How can I get information on withdrawal? GET READY  Set a quit date.  Change your environment by getting rid of all cigarettes, ashtrays, matches, and lighters in your home, car, or work. Do not let people smoke in your home.  Review your past attempts to quit. Think about what worked and what did not. GET SUPPORT AND ENCOURAGEMENT You have a better chance of being successful if you have help. You can get support in many ways.  Tell your family, friends, and coworkers that you are going to quit and need their support. Ask them not to smoke around you.  Get individual, group, or telephone counseling and support. Programs are available at local hospitals and health centers. Call   your local health department for information about programs in your area.  Spiritual beliefs and practices may help some smokers quit.  Download a "quit meter" on your computer to keep track of quit statistics, such as how long you have gone without smoking, cigarettes not smoked, and money saved.  Get a self-help book about quitting smoking and staying off tobacco. LEARN NEW SKILLS AND BEHAVIORS  Distract yourself from urges to smoke. Talk to someone, go for a walk, or occupy your time with a task.  Change your normal routine. Take a different route to work.  Drink tea instead of coffee. Eat breakfast in a different place.  Reduce your stress. Take a hot bath, exercise, or read a book.  Plan something enjoyable to do every day. Reward yourself for not smoking.  Explore interactive web-based programs that specialize in helping you quit. GET MEDICINE AND USE IT CORRECTLY Medicines can help you stop smoking and decrease the urge to smoke. Combining medicine with the above behavioral methods and support can greatly increase your chances of successfully quitting smoking.  Nicotine replacement therapy helps deliver nicotine to your body without the negative effects and risks of smoking. Nicotine replacement therapy includes nicotine gum, lozenges, inhalers, nasal sprays, and skin patches. Some may be available over-the-counter and others require a prescription.  Antidepressant medicine helps people abstain from smoking, but how this works is unknown. This medicine is available by prescription.  Nicotinic receptor partial agonist medicine simulates the effect of nicotine in your brain. This medicine is available by prescription. Ask your health care provider for advice about which medicines to use and how to use them based on your health history. Your health care provider will tell you what side effects to look out for if you choose to be on a medicine or therapy. Carefully read the information on the package. Do not use any other product containing nicotine while using a nicotine replacement product.  RELAPSE OR DIFFICULT SITUATIONS Most relapses occur within the first 3 months after quitting. Do not be discouraged if you start smoking again. Remember, most people try several times before finally quitting. You may have symptoms of withdrawal because your body is used to nicotine. You may crave cigarettes, be irritable, feel very hungry, cough often, get headaches, or have difficulty concentrating. The withdrawal symptoms are only temporary. They are strongest  when you first quit, but they will go away within 10-14 days. To reduce the chances of relapse, try to:  Avoid drinking alcohol. Drinking lowers your chances of successfully quitting.  Reduce the amount of caffeine you consume. Once you quit smoking, the amount of caffeine in your body increases and can give you symptoms, such as a rapid heartbeat, sweating, and anxiety.  Avoid smokers because they can make you want to smoke.  Do not let weight gain distract you. Many smokers will gain weight when they quit, usually less than 10 pounds. Eat a healthy diet and stay active. You can always lose the weight gained after you quit.  Find ways to improve your mood other than smoking. FOR MORE INFORMATION  www.smokefree.gov  Document Released: 03/07/2001 Document Revised: 07/28/2013 Document Reviewed: 06/22/2011 [employer] Patient Information 2015 [employer]. This information is not intended to replace advice given to you by your health care provider. Make sure you discuss any questions you have with your health care provider.  

## 2013-12-13 LAB — TSH: TSH: 1.614 u[IU]/mL (ref 0.350–4.500)

## 2013-12-13 LAB — VITAMIN D 25 HYDROXY (VIT D DEFICIENCY, FRACTURES): VIT D 25 HYDROXY: 16 ng/mL — AB (ref 30–89)

## 2013-12-15 ENCOUNTER — Telehealth: Payer: Self-pay | Admitting: Emergency Medicine

## 2013-12-15 DIAGNOSIS — Z1211 Encounter for screening for malignant neoplasm of colon: Secondary | ICD-10-CM

## 2013-12-15 NOTE — Telephone Encounter (Signed)
Pt given lab results with medication instructions to start taking prescribed Vitamin D 50,000 units x 12 weeks Pt also provided education on Low carb diet/exercise to prevent Diabetes Medication e-scribed to [facility] GI referral placed for Colonoscopy

## 2013-12-15 NOTE — Telephone Encounter (Signed)
Message copied by Darlis Loan on Mon Dec 15, 2013  2:55 PM ------      Message from: Doris Cheadle      Created: Mon Dec 15, 2013  1:25 PM       Blood work reviewed, noticed low vitamin D, call patient advise to start ergocalciferol 50,000 units once a week for the duration of  12 weeks.      Blood work reviewed noticed impaired fasting glucose, call and advise patient for low carbohydrate diet.      Also patient has borderline anemia, if patient never had a colonoscopy done, patient need to do it  please put in the referral to GI. ------

## 2013-12-16 ENCOUNTER — Other Ambulatory Visit: Payer: Self-pay | Admitting: Emergency Medicine

## 2013-12-16 MED ORDER — VITAMIN D (ERGOCALCIFEROL) 1.25 MG (50000 UNIT) PO CAPS: 50000.0000 [IU] | ORAL_CAPSULE | ORAL | Status: AC

## 2014-01-14 ENCOUNTER — Encounter: Payer: Self-pay | Admitting: Internal Medicine

## 2015-12-16 ENCOUNTER — Encounter (HOSPITAL_COMMUNITY): Payer: Self-pay | Admitting: Emergency Medicine

## 2015-12-16 ENCOUNTER — Emergency Department (HOSPITAL_COMMUNITY)
Admission: EM | Admit: 2015-12-16 | Discharge: 2015-12-16 | Disposition: A | Discharge status: Home / Self Care | Payer: Self-pay | Attending: Emergency Medicine | Admitting: Emergency Medicine

## 2015-12-16 DIAGNOSIS — Z7982 Long term (current) use of aspirin: Secondary | ICD-10-CM | POA: Insufficient documentation

## 2015-12-16 DIAGNOSIS — F17200 Nicotine dependence, unspecified, uncomplicated: Secondary | ICD-10-CM | POA: Insufficient documentation

## 2015-12-16 DIAGNOSIS — H00013 Hordeolum externum right eye, unspecified eyelid: Secondary | ICD-10-CM

## 2015-12-16 DIAGNOSIS — H00011 Hordeolum externum right upper eyelid: Secondary | ICD-10-CM | POA: Insufficient documentation

## 2015-12-16 NOTE — ED Notes (Signed)
[facility] 15- COUPON GIVEN

## 2015-12-16 NOTE — Discharge Instructions (Signed)
Continue doing warm compresses 2-3 times a day until swelling improves.  Return to ER for changes in vision, new or worsening symptoms, any additional concerns.

## 2015-12-16 NOTE — ED Triage Notes (Signed)
C/O RIGHT EYE LID SWELLING, TENDERNESS AND REDNESS X 2 WEEKS. NO KNOWN INJURY.

## 2015-12-16 NOTE — ED Provider Notes (Signed)
[facility]-EMER[facility]ENCY DEPT Provider Note   CSN: [id number] Arrival date & time: 12/16/15  1010  By signing my name below, I, Placido SouLogan Joldersma, attest that this documentation has been prepared under the direction and in the presence of [facility] HospitalJaime Pilcher Caidence Higashi, PA-C. Electronically Signed: Placido SouLogan Joldersma, ED Scribe. 12/16/15. 11:41 AM.   History   Chief Complaint Chief Complaint  Patient presents with  . Eye Problem    HPI HPI Comments: Jacob Mathews is a 55 y.o. male who presents to the Emergency Department complaining of worsening, mild, right upper eyelid swelling x 2 weeks. He reports associated, mild, redness across the region. Pt states he has applied warm and cold compresses to the eye daily for about 15 minutes w/o relief. No visual changes, cough, congestion or other associated symptoms at this time.   The history is provided by the patient. No language interpreter was used.    History reviewed. No pertinent past medical history.  Patient Active Problem List   Diagnosis Date Noted  . Right inguinal hernia 12/12/2013  . Smoking 12/12/2013    Past Surgical History:  Procedure Laterality Date  . HERNIA REPAIR         Home Medications    Prior to Admission medications   Medication Sig Start Date End Date Taking? Authorizing Provider  Aspirin-Caffeine (BC FAST PAIN RELIEF PO) Take 1 packet by mouth 2 (two) times daily as needed (for pain/fever).    Historical Provider, MD  hydrocortisone cream 1 % Apply 1 application topically 2 (two) times daily. 12/12/13   Doris Cheadleeepak Advani, MD  ketoconazole (NIZORAL) 2 % cream Apply 1 application topically daily. For 2 weeks 12/05/13   Marisa Severinlga Otter, MD  nicotine (NICODERM CQ) 21 mg/24hr patch Place 1 patch (21 mg total) onto the skin daily. 12/12/13   Doris Cheadleeepak Advani, MD  oxyCODONE-acetaminophen (PERCOCET/ROXICET) 5-325 MG per tablet Take 2 tablets by mouth every 4 (four) hours as needed for severe pain. 12/05/13   Marisa Severinlga Otter, MD  Vitamin D,  Ergocalciferol, (DRISDOL) 50000 UNITS CAPS capsule Take 1 capsule (50,000 Units total) by mouth every 7 (seven) days. 12/16/13   Doris Cheadleeepak Advani, MD    Family History Family History  Problem Relation Age of Onset  . Hypertension Mother   . Stroke Mother   . Hypertension Sister   . Diabetes Sister   . Hypertension Brother   . Diabetes Brother   . Stroke Brother     Social History Social History  Substance Use Topics  . Smoking status: Current Every Day Smoker    Packs/day: 0.50    Years: 40.00  . Smokeless tobacco: Not on file  . Alcohol use Yes     Allergies   Review of patient's allergies indicates no known allergies.   Review of Systems Review of Systems  Constitutional: Negative for fever.  HENT: Positive for facial swelling (Eyelid).   Eyes: Positive for pain. Negative for visual disturbance.  Skin: Positive for color change.   Physical Exam Updated Vital Signs BP 123/73 (BP Location: Right Arm)   Pulse 69   Temp 98.7 F (37.1 C) (Oral)   Resp 18   Ht 6\' 5"  (1.956 m)   Wt 100.2 kg   SpO2 98%   BMI 26.21 kg/m   Physical Exam  Constitutional: He is oriented to person, place, and time. He appears well-developed and well-nourished. No distress.  HENT:  Head: Normocephalic and atraumatic.  Mouth/Throat: Oropharynx is clear and moist.  Eyes: Conjunctivae, EOM and lids are  normal. Pupils are equal, round, and reactive to light. Lids are everted and swept, no foreign bodies found. Right eye exhibits no discharge. Left eye exhibits no discharge. Right conjunctiva is not injected. Left conjunctiva is not injected. No scleral icterus.  Upper right eyelid with 1 cm area of fluctuance consistent with stye. EOMI w/o pain.   Neck: Neck supple.  Cardiovascular: Normal rate, regular rhythm and normal heart sounds.   Pulmonary/Chest: Effort normal and breath sounds normal. No respiratory distress. He has no wheezes. He has no rales.  Musculoskeletal: He exhibits no edema.    Neurological: He is alert and oriented to person, place, and time.  Skin: Skin is warm and dry.  Nursing note and vitals reviewed.  ED Treatments / Results  Labs (all labs ordered are listed, but only abnormal results are displayed) Labs Reviewed - No data to display  EKG  EKG Interpretation None       Radiology No results found.  Procedures Procedures  DIAGNOSTIC STUDIES: Oxygen Saturation is 98% on RA, normal by my interpretation.    COORDINATION OF CARE: 11:39 AM Discussed next steps with pt. Pt verbalized understanding and is agreeable with the plan.    Medications Ordered in ED Medications - No data to display   Initial Impression / Assessment and Plan / ED Course  I have reviewed the triage vital signs and the nursing notes.  Pertinent labs & imaging results that were available during my care of the patient were reviewed by me and considered in my medical decision making (see chart for details).  Clinical Course   Jacob Mathews is a 55 y.o. male who presents to ED for eyelid swelling c/w stye. He has been applying warm compresses with little relief. No FB seen and no visual changes. Stye was manually expressed by me with gentle palpation and patient tolerated this well. He immediately had improvement in pain. Continue with warm compresses until swelling improves. Return precautions and follow up care discussed. All questions answered.   I personally performed the services described in this documentation, which was scribed in my presence. The recorded information has been reviewed and is accurate.  Final Clinical Impressions(s) / ED Diagnoses   Final diagnoses:  Stye external, right    New Prescriptions New Prescriptions   No medications on file     [facility] Ardie Dragoo, PA-C 12/16/15 1148    Jacalyn Lefevre, MD 12/16/15 1315

## 2018-04-28 ENCOUNTER — Encounter (HOSPITAL_COMMUNITY): Payer: Self-pay | Admitting: Emergency Medicine

## 2018-04-28 ENCOUNTER — Emergency Department (HOSPITAL_COMMUNITY): Payer: Self-pay

## 2018-04-28 ENCOUNTER — Emergency Department (HOSPITAL_COMMUNITY)
Admission: EM | Admit: 2018-04-28 | Discharge: 2018-04-28 | Disposition: A | Discharge status: Home / Self Care | Payer: Self-pay | Attending: Emergency Medicine | Admitting: Emergency Medicine

## 2018-04-28 ENCOUNTER — Other Ambulatory Visit: Payer: Self-pay

## 2018-04-28 DIAGNOSIS — M5440 Lumbago with sciatica, unspecified side: Secondary | ICD-10-CM

## 2018-04-28 DIAGNOSIS — M5442 Lumbago with sciatica, left side: Secondary | ICD-10-CM | POA: Insufficient documentation

## 2018-04-28 DIAGNOSIS — Z87891 Personal history of nicotine dependence: Secondary | ICD-10-CM | POA: Insufficient documentation

## 2018-04-28 LAB — COMPREHENSIVE METABOLIC PANEL
ALT: 12 U/L (ref 0–44)
AST: 24 U/L (ref 15–41)
Albumin: 2.9 g/dL — ABNORMAL LOW (ref 3.5–5.0)
Alkaline Phosphatase: 58 U/L (ref 38–126)
Anion gap: 9 (ref 5–15)
BILIRUBIN TOTAL: 0.2 mg/dL — AB (ref 0.3–1.2)
BUN: 9 mg/dL (ref 6–20)
CHLORIDE: 107 mmol/L (ref 98–111)
CO2: 23 mmol/L (ref 22–32)
Calcium: 8.2 mg/dL — ABNORMAL LOW (ref 8.9–10.3)
Creatinine, Ser: 0.79 mg/dL (ref 0.61–1.24)
GFR calc non Af Amer: >60 mL/min (ref >60)
Glucose, Bld: 102 mg/dL — ABNORMAL HIGH (ref 70–99)
Potassium: 3.3 mmol/L — ABNORMAL LOW (ref 3.5–5.1)
Sodium: 139 mmol/L (ref 135–145)
TOTAL PROTEIN: 7.9 g/dL (ref 6.5–8.1)

## 2018-04-28 LAB — CBC
HEMATOCRIT: 34.8 % — AB (ref 39.0–52.0)
Hemoglobin: 10.6 g/dL — ABNORMAL LOW (ref 13.0–17.0)
MCH: 26.5 pg (ref 26.0–34.0)
MCHC: 30.5 g/dL (ref 30.0–36.0)
MCV: 87 fL (ref 80.0–100.0)
Platelets: 410 10*3/uL — ABNORMAL HIGH (ref 150–400)
RBC: 4 MIL/uL — ABNORMAL LOW (ref 4.22–5.81)
RDW: 15.9 % — ABNORMAL HIGH (ref 11.5–15.5)
WBC: 7.2 10*3/uL (ref 4.0–10.5)
nRBC: 0 % (ref 0.0–0.2)

## 2018-04-28 LAB — LIPASE, BLOOD: LIPASE: 22 U/L (ref 11–51)

## 2018-04-28 MED ORDER — IBUPROFEN 800 MG PO TABS
800.0000 mg | ORAL_TABLET | Freq: Once | ORAL | Status: AC
  Administered 2018-04-28: 800 mg via ORAL
  Filled 2018-04-28: qty 1

## 2018-04-28 MED ORDER — SODIUM CHLORIDE 0.9% FLUSH: 3.0000 mL | Freq: Once | INTRAVENOUS | Status: DC

## 2018-04-28 MED ORDER — IBUPROFEN 600 MG PO TABS: 600.0000 mg | ORAL_TABLET | Freq: Four times a day (QID) | ORAL | 0 refills | Status: AC | PRN

## 2018-04-28 NOTE — ED Provider Notes (Signed)
[facility]-EMERGENCY DEPT Provider Note   CSN: [id number] Arrival date & time: 04/28/18  1442     History   Chief Complaint Chief Complaint  Patient presents with  . Back Pain  . Nausea    HPI Jacob Mathews is a 58 y.o. male.  This is a 58 year old male presents with left-sided flank pain characterizes dull and worse with any movement.  States that the pain does not radiate down to his leg.  He has not had any urinary symptoms.  Has had these current symptoms for several months.  Pain gets so severe at times that he has nausea.  He also states that he sometimes has pain at his umbilicus where he has a prior umbilical hernia.  Denies any recent fever or vomiting.  Symptoms better with remaining still and no treatment used prior to arrival.     History reviewed. No pertinent past medical history.  Patient Active Problem List   Diagnosis Date Noted  . Right inguinal hernia 12/12/2013  . Smoking 12/12/2013    Past Surgical History:  Procedure Laterality Date  . HERNIA REPAIR          Home Medications    Prior to Admission medications   Medication Sig Start Date End Date Taking? Authorizing Provider  Aspirin-Caffeine (BC FAST PAIN RELIEF PO) Take 1 packet by mouth 2 (two) times daily as needed (for pain/fever).   Yes [provider]  hydrocortisone cream 1 % Apply 1 application topically 2 (two) times daily. Patient not taking: Reported on 04/28/2018 12/12/13   Doris Cheadle, MD  ketoconazole (NIZORAL) 2 % cream Apply 1 application topically daily. For 2 weeks Patient not taking: Reported on 04/28/2018 12/05/13   Marisa Severin, MD  nicotine (NICODERM CQ) 21 mg/24hr patch Place 1 patch (21 mg total) onto the skin daily. Patient not taking: Reported on 04/28/2018 12/12/13   Doris Cheadle, MD  oxyCODONE-acetaminophen (PERCOCET/ROXICET) 5-325 MG per tablet Take 2 tablets by mouth every 4 (four) hours as needed for severe pain. Patient not taking:  Reported on 04/28/2018 12/05/13   Marisa Severin, MD  Vitamin D, Ergocalciferol, (DRISDOL) 50000 UNITS CAPS capsule Take 1 capsule (50,000 Units total) by mouth every 7 (seven) days. Patient not taking: Reported on 04/28/2018 12/16/13   Doris Cheadle, MD    Family History Family History  Problem Relation Age of Onset  . Hypertension Mother   . Stroke Mother   . Hypertension Sister   . Diabetes Sister   . Hypertension Brother   . Diabetes Brother   . Stroke Brother     Social History Social History   Tobacco Use  . Smoking status: Former Smoker    Packs/day: 0.50    Years: 40.00    Pack years: 20.00  . Smokeless tobacco: Never Used  Substance Use Topics  . Alcohol use: Yes  . Drug use: No     Allergies   Patient has no known allergies.   Review of Systems Review of Systems  All other systems reviewed and are negative.    Physical Exam Updated Vital Signs BP (!) 147/81 (BP Location: Left Arm)   Pulse 73   Temp 98.7 F (37.1 C) (Oral)   Resp 18   Wt 87.1 kg   SpO2 100%   BMI 22.77 kg/m   Physical Exam Vitals signs and nursing note reviewed.  Constitutional:      General: He is not in acute distress.    Appearance: Normal appearance.  He is well-developed. He is not toxic-appearing.  HENT:     Head: Normocephalic and atraumatic.  Eyes:     General: Lids are normal.     Conjunctiva/sclera: Conjunctivae normal.     Pupils: Pupils are equal, round, and reactive to light.  Neck:     Musculoskeletal: Normal range of motion and neck supple.     Thyroid: No thyroid mass.     Trachea: No tracheal deviation.  Cardiovascular:     Rate and Rhythm: Normal rate and regular rhythm.     Heart sounds: Normal heart sounds. No murmur. No gallop.   Pulmonary:     Effort: Pulmonary effort is normal. No respiratory distress.     Breath sounds: Normal breath sounds. No stridor. No decreased breath sounds, wheezing, rhonchi or rales.  Abdominal:     General: Bowel sounds are  normal. There is no distension.     Palpations: Abdomen is soft.     Tenderness: There is no abdominal tenderness. There is no rebound.    Musculoskeletal: Normal range of motion.        General: No tenderness.       Back:  Skin:    General: Skin is warm and dry.     Findings: No abrasion or rash.  Neurological:     Mental Status: He is alert and oriented to person, place, and time.     GCS: GCS eye subscore is 4. GCS verbal subscore is 5. GCS motor subscore is 6.     Cranial Nerves: No cranial nerve deficit.     Sensory: No sensory deficit.     Motor: No weakness, tremor or atrophy.     Comments: Strength is 5/5 in his upper and lower extremities.  Psychiatric:        Speech: Speech normal.        Behavior: Behavior normal.      ED Treatments / Results  Labs (all labs ordered are listed, but only abnormal results are displayed) Labs Reviewed  COMPREHENSIVE METABOLIC PANEL - Abnormal; Notable for the following components:      Result Value   Potassium 3.3 (*)    Glucose, Bld 102 (*)    Calcium 8.2 (*)    Albumin 2.9 (*)    Total Bilirubin 0.2 (*)    All other components within normal limits  CBC - Abnormal; Notable for the following components:   RBC 4.00 (*)    Hemoglobin 10.6 (*)    HCT 34.8 (*)    RDW 15.9 (*)    Platelets 410 (*)    All other components within normal limits  LIPASE, BLOOD  URINALYSIS, ROUTINE W REFLEX MICROSCOPIC    EKG None  Radiology No results found.  Procedures Procedures (including critical care time)  Medications Ordered in ED Medications  sodium chloride flush (NS) 0.9 % injection 3 mL (has no administration in time range)  ibuprofen (ADVIL,MOTRIN) tablet 800 mg (has no administration in time range)     Initial Impression / Assessment and Plan / ED Course  I have reviewed the triage vital signs and the nursing notes.  Pertinent labs & imaging results that were available during my care of the patient were reviewed by me  and considered in my medical decision making (see chart for details).     Patient given Motrin here and feels better.  LS spine series negative.  No focal deficits on exam.  Stable for discharge  Final Clinical Impressions(s) / ED  Diagnoses   Final diagnoses:  None    ED Discharge Orders    None       Lorre NickAllen, Darbi Chandran, MD 04/28/18 1729

## 2018-04-28 NOTE — ED Triage Notes (Addendum)
Pt with L lower back pain and pt states he has nausea at times. Pt cannot recall injury. Pt states his abdomen feels distended and he has poor appetite.Pt states he has had poor appetite x 3 months and has lost weight over the past 3 months.
